# Patient Record
Sex: Female | Born: 2000 | Race: Black or African American | Hispanic: No | Marital: Single | Smoking: Never smoker
Health system: Southern US, Community
[De-identification: ages and names within clinical notes are randomized; demographics above are authoritative.]

## PROBLEM LIST (undated history)

## (undated) DIAGNOSIS — H539 Unspecified visual disturbance: Secondary | ICD-10-CM

## (undated) DIAGNOSIS — M419 Scoliosis, unspecified: Secondary | ICD-10-CM

## (undated) DIAGNOSIS — S83003A Unspecified subluxation of unspecified patella, initial encounter: Secondary | ICD-10-CM

## (undated) HISTORY — DX: Unspecified subluxation of unspecified patella, initial encounter: S83.003A

## (undated) HISTORY — DX: Unspecified visual disturbance: H53.9

## (undated) HISTORY — DX: Scoliosis, unspecified: M41.9

## (undated) NOTE — Progress Notes (Signed)
 Formatting of this note is different from the original. Images from the original note were not included. [facility] PROGRESS NOTE - CONFIDENTIAL   Visit Type:  Video Visit [facility]  Patient is attending visit via a telehealth appointment today. Patient has agreed to complete the appointment via telehealth, and is currently located within a jurisdiction this practitioner is licensed.  I have reviewed the patient's chart.  I have verified the patient's identity and the patient has consented to the Telemedicine appointment.  Due to appointment being via telehealth no signature was received, but patient has verbally consented to treatment & agreed to all treatment goals and objectives.  EMR reviewed and patient identity confirmed. 03/16/2024 4:29 PM  [facility] Coordinated Care Privacy Notice was reviewed by the member and access to documentation reviewed.  The member will be able to access all of this information via [email or web address] or HIMS  Confidentiality and its limits were reviewed today.  Patient AGREED to coordinated care, electing not to sign the Behavioral Health Coordination Opt Out Form, signifying his/her desire to not separate behavioral health information  from the routine health record. Proactive Care recommendations were reviewed with the patient. Patient seen alone with: CARRAH RIDDLE LCSW.   Length of session: 16-37 minutes  S:   Member complains of symptoms including depressed mood, decreased energy.   Met with member today who reported sleep disturbances. Experiencing physical symptoms of stress. Is on hydroxizine   O: MSE:  Appearance: well-groomed, clean, appropriately dressed, and appears their stated age Behavior: within normal limits, cooperative, and engages easily and relates well Demeanor/Manner: normal and cooperative Speech: normal (fluid and clear) Mood: Presentation - euthymic, irritable, anxious, and depressed Affect: congruent with  mood and within normal limits Thought Process: coherent, logical, relevant, and organized Thought Content: able to provide a cogent history, no evidence of perceptual difficulties (including AVH, PTSD type symptoms with flashbacks and dissociation, and paranoid or delusional thinking), denies suicidal ideation, plan, or intent, and denies homicidal ideation, plan, or intent Attention & Orientation: person, place, date, situation  Concentration: normal for interactions of session Intellectual Functioning/Memory:  intact recent and intact remote, not formally tested Impulse Control: Average, adequate for this appointment Insight:  Average, adequate for this appointment Judgment: Good/Socially appropriate    02/29/2024   12:45 PM 03/16/2024   11:00 AM  Behavioral Health Outcomes Questionnaire  Loss of Interest/Pleasure 1 - several days 1 - several days  Depressed Mood 2 - more than half the days 1 - several days  Insomnia/Hypersomnia 0 - not at all 1 - several days  Fatigue/Low Energy 1 - several days 0 - not at all  Appetite Disturbance 0 - not at all 1 - several days  Feelings of guilt/worthlessness 1 - several days 1 - several days  Memory/Concentration Problems 1 - several days 0 - not at all  Psychomotor Retardation/Agitation 0 - not at all 0 - not at all  Thoughts of Death/Self-Harm 0 - not at all 0 - not at all  PHQ-9 TOTAL SCORE 6 5  Depression Severity  5-9 Mild 5-9 Mild  Feeling nervous, anxious or on edge 2 - more than half the days 1 - several days  Not being able to stop or control worrying 1 - several days 1 - several days  How well have you been getting along emotionally 1 - fairly well 2 - fairly poorly  How well have you been able to manage your day to day life  1 - fairly well 2 - fairly poorly  GAD-2 and DISTRESS TOTAL SCORE 5 6  PHQ-9, GAD and DISTRESS TOTAL SCORE 11 11     No data to display      Safety Plan:  N/A  A: DSM V Diagnoses  1. UNSPECIFIED ADJUSTMENT  DISORDER   2. BORDERLINE PERSONALITY DISORDER      P:  Progress towards goals and Treatment Plan: Pt improving with talk therapy  Psychotherapy Interventions: Solution focused reframing to elicit goal and future focused talk Eliciting positive experiences and exceptions to problematic symptoms Encouragement to maintain current positive and effective coping tools and strategies  Homework Assigned: yes    Goodness of Fit Reviewed/Competed:No Community Resources: no    We discussed the diagnosis, the reasonable treatment options and the current treatment recommendations.  An opportunity for questions was given. The patient reported that they are safe to leave from appt.   This clinical research associate reviewed [employer] including the available Emergency phone number, the Medical Advice line, and Urgent Care services. This clinical research associate also reviewed guidelines if patient has urgent concerns or in the event of an emergency situation including calling 9-1-1 or going to the closest emergency room.  Henrine acknowledged understanding of these services and plan.    Information in Visit Notes was reviewed with the patient.  Follow-Up Appointment: To contact the undersigned if he/she has any questions or concerns or requires further assistance.   [facility] RIDDLE LCSW, March 16, 2024   Electronically signed by Beecher Raspberry (L.C.S.W.), L.C.S.W. at 03/28/2024  9:43 PM EST

---
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
## 2020-07-28 ENCOUNTER — Emergency Department (HOSPITAL_COMMUNITY)
Admission: EM | Admit: 2020-07-28 | Discharge: 2020-07-28 | Disposition: A | Discharge status: Home / Self Care | Payer: Medicaid - Out of State | Attending: Emergency Medicine | Admitting: Emergency Medicine

## 2020-07-28 ENCOUNTER — Encounter (HOSPITAL_COMMUNITY): Payer: Self-pay

## 2020-07-28 ENCOUNTER — Other Ambulatory Visit: Payer: Self-pay

## 2020-07-28 DIAGNOSIS — K648 Other hemorrhoids: Secondary | ICD-10-CM | POA: Diagnosis not present

## 2020-07-28 DIAGNOSIS — K625 Hemorrhage of anus and rectum: Secondary | ICD-10-CM | POA: Diagnosis present

## 2020-07-28 DIAGNOSIS — K644 Residual hemorrhoidal skin tags: Secondary | ICD-10-CM

## 2020-07-28 LAB — CBC
HCT: 43.9 % (ref 36.0–46.0)
Hemoglobin: 13.8 g/dL (ref 12.0–15.0)
MCH: 27.4 pg (ref 26.0–34.0)
MCHC: 31.4 g/dL (ref 30.0–36.0)
MCV: 87.3 fL (ref 80.0–100.0)
Platelets: 271 10*3/uL (ref 150–400)
RBC: 5.03 MIL/uL (ref 3.87–5.11)
RDW: 13.1 % (ref 11.5–15.5)
WBC: 6.3 10*3/uL (ref 4.0–10.5)
nRBC: 0 % (ref 0.0–0.2)

## 2020-07-28 LAB — COMPREHENSIVE METABOLIC PANEL
ALT: 12 U/L (ref 0–44)
AST: 23 U/L (ref 15–41)
Albumin: 3.8 g/dL (ref 3.5–5.0)
Alkaline Phosphatase: 59 U/L (ref 38–126)
Anion gap: 9 (ref 5–15)
BUN: 8 mg/dL (ref 6–20)
CO2: 24 mmol/L (ref 22–32)
Calcium: 9 mg/dL (ref 8.9–10.3)
Chloride: 104 mmol/L (ref 98–111)
Creatinine, Ser: 1.01 mg/dL — ABNORMAL HIGH (ref 0.44–1.00)
GFR, Estimated: >60 mL/min (ref >60)
Glucose, Bld: 76 mg/dL (ref 70–99)
Potassium: 3.6 mmol/L (ref 3.5–5.1)
Sodium: 137 mmol/L (ref 135–145)
Total Bilirubin: 0.5 mg/dL (ref 0.3–1.2)
Total Protein: 7.1 g/dL (ref 6.5–8.1)

## 2020-07-28 LAB — LIPASE, BLOOD: Lipase: 22 U/L (ref 11–51)

## 2020-07-28 MED ORDER — HYDROCORTISONE (PERIANAL) 2.5 % EX CREA: 1.0000 "application " | TOPICAL_CREAM | Freq: Two times a day (BID) | CUTANEOUS | 0 refills | Status: AC

## 2020-07-28 MED ORDER — ONDANSETRON 4 MG PO TBDP: 4.0000 mg | ORAL_TABLET | Freq: Three times a day (TID) | ORAL | 0 refills | Status: AC | PRN

## 2020-07-28 MED ORDER — POLYETHYLENE GLYCOL 3350 17 G PO PACK: 17.0000 g | PACK | Freq: Every day | ORAL | 0 refills | Status: AC | PRN

## 2020-07-28 NOTE — ED Triage Notes (Signed)
Patient reports abdominal pain x 3 days, states she thought she might have food poisoning, stated with rectal bleeding last night

## 2020-07-28 NOTE — ED Triage Notes (Signed)
Emergency Medicine Provider Triage Evaluation Note  Michele Soto , a 20 y.o. female  was evaluated in triage.  Pt complains of recent epigastric pain. Pt states unsure of related to eating some food that was left out. Had episode of nausea/vomiting earlier - nausea currently resolved. Also states noted small amount bright red blood per rectum. No hx fissure or hemorrhoid. No melena. No vaginal bleeding - lnmp 1 month ago, takes ocp, and indicates is due to period to start in next 1-2 days. No faintness/lightheadedness. Hx anemia  Review of Systems  Positive: abd pain Negative: No fever.   Physical Exam  BP (!) 149/96 (BP Location: Right Arm)   Pulse (!) 103   Temp 98.4 F (36.9 C) (Oral)   Resp 18   SpO2 99%  Gen:   Awake, no distress   HEENT:  Atraumatic  Resp:  Normal effort  Cardiac:  Normal rate  Abd:   Nondistended, nontender  Medical Decision Making  Medically screening exam initiated at 8:14 PM.  Appropriate orders placed.  [name] was informed that the remainder of the evaluation will be completed by another provider, this initial triage assessment does not replace that evaluation, and the importance of remaining in the ED until their evaluation is complete.  Clinical Impression  Labs sent. Will move to treatment room once available.    [name], MD 07/28/20 2016

## 2020-07-28 NOTE — ED Provider Notes (Signed)
[facility] EMERGENCY DEPARTMENT Provider Note   CSN: [id number] Arrival date & time: 07/28/20  2004     History Chief Complaint  Patient presents with  . Abdominal Pain  . Rectal Bleeding    Michele Soto is a 20 y.o. female with a hx of scoliosis who presents to the ED with reports of abdominal discomfort & concern for rectal bleeding. Patient reports she ate some shrimp yesterday that she thinks were bad as she developed some epigastric discomfort shortly following consumption with some associated nausea. This AM she felt better therefore she tried to eat some rice with return of discomfort & nausea with 1 episode of emesis. Her nausea & pain have resolved and she has since tolerated PO. She however does note that she has had 2 episodes of bright red blood on the toilet paper from her rectum when wiping after having to strain for a BM, once yesterday and once today. Given the blood she decided to come to the ED to get checked out. Has since had a BM without bleeding. Did have some discomfort when straining, none now. No other alleviating/aggravaitng factors to her sxs. Denies fever, chills, melena, hematochezia, dizziness, lightheadedness, hematemesis, dysuria, vaginal bleeding, or vaginal discharge. LMP 1 month ago. Does not typically use NSAIDs, not on anticoagulation.   HPI     Past Medical History:  Diagnosis Date  . Allergic rhinitis 05/05/2011  . Scoliosis   . Subluxation of patella    bilateral, recurrent  . Vision abnormalities    failed vision screen twice referred to opthalmology    Patient Active Problem List   Diagnosis Date Noted  . 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 Allergic rhinitis 05/05/2011  . Scoliosis 05/05/2011  . 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 scoliosis  2010   surgery at [facility], Dr. Rochele Pages     OB History   No obstetric history on file.     History reviewed. No  pertinent family history.  Social History   Tobacco Use  . Smoking status: Never Smoker  . Smokeless tobacco: Never Used  Substance Use Topics  . Alcohol use: No  . Drug use: No    Home Medications Prior to Admission medications   Medication Sig Start Date End Date Taking? Authorizing Provider  acetaminophen (TYLENOL) 500 MG tablet Take 500 mg by mouth every 6 (six) hours as needed for moderate pain or headache.   Yes [provider]  albuterol (PROVENTIL HFA;VENTOLIN HFA) 108 (90 BASE) MCG/ACT inhaler 2 puffs 30-45 minutes prior to exercise. Patient taking differently: Inhale 1-2 puffs into the lungs daily as needed for wheezing or shortness of breath. 07/14/11 08/13/11 Yes Lucio Edward, MD  Artificial Tear Ointment (DRY EYES OP) Place 1 drop into both eyes daily as needed (dry eyes).   Yes [provider]  JUNEL FE 1/20 1-20 MG-MCG tablet Take 1 tablet by mouth daily. 05/01/20  Yes [provider]  loratadine (CLARITIN) 10 MG tablet Take 10 mg by mouth at bedtime.   Yes [provider]  montelukast (SINGULAIR) 10 MG tablet Take 10 mg by mouth at bedtime. 05/07/20  Yes [provider]  OVER THE COUNTER MEDICATION Take 1 tablet by mouth every evening. Iron supplement gummy   Yes [provider]    Allergies    Amoxicillin and Penicillins  Review of Systems   Review of Systems  Constitutional: Negative for chills  and fever.  Respiratory: Negative for shortness of breath.   Cardiovascular: Negative for chest pain.  Gastrointestinal: Positive for abdominal pain, anal bleeding, constipation, nausea and vomiting.  Genitourinary: Negative for dysuria, vaginal bleeding and vaginal discharge.  Neurological: Negative for dizziness, syncope and light-headedness.  All other systems reviewed and are negative.   Physical Exam Updated Vital Signs BP (!) 149/96 (BP Location: Right Arm)   Pulse (!) 103   Temp 98.4 F (36.9 C) (Oral)    Resp 18   Ht 5\' 4"  (1.626 m)   Wt 68 kg   LMP 06/26/2020   SpO2 99%   BMI 25.75 kg/m   Physical Exam Vitals and nursing note reviewed.  Constitutional:      General: She is not in acute distress.    Appearance: She is well-developed. She is not toxic-appearing.  HENT:     Head: Normocephalic and atraumatic.  Eyes:     General:        Right eye: No discharge.        Left eye: No discharge.     Conjunctiva/sclera: Conjunctivae normal.  Cardiovascular:     Rate and Rhythm: Normal rate and regular rhythm.  Pulmonary:     Effort: Pulmonary effort is normal. No respiratory distress.     Breath sounds: Normal breath sounds. No wheezing, rhonchi or rales.  Abdominal:     General: There is no distension.     Palpations: Abdomen is soft.     Tenderness: There is no abdominal tenderness. There is no guarding or rebound.  Genitourinary:    Comments: NT present as chaperone.  Patient has a very small external hemorrhoid which is non-thrombosed with no active bleeding. No blood to external rectum. No melena noted. No areas of fluctuance/induration.  Musculoskeletal:     Cervical back: Neck supple.  Skin:    General: Skin is warm and dry.     Findings: No rash.  Neurological:     Mental Status: She is alert.     Comments: Clear speech.   Psychiatric:        Behavior: Behavior normal.     ED Results / Procedures / Treatments   Labs (all labs ordered are listed, but only abnormal results are displayed) Labs Reviewed  COMPREHENSIVE METABOLIC PANEL - Abnormal; Notable for the following components:      Result Value   Creatinine, Ser 1.01 (*)    All other components within normal limits  CBC  LIPASE, BLOOD    EKG None  Radiology No results found.  Procedures Procedures   Medications Ordered in ED Medications - No data to display  ED Course  I have reviewed the triage vital signs and the nursing notes.  Pertinent labs & imaging results that were available during my  care of the patient were reviewed by me and considered in my medical decision making (see chart for details).    MDM Rules/Calculators/A&P                         Patient presents to the ED with reports of N/V & abdominal discomfort which is now resolved & 2 episodes of rectal bleeding- blood on toilet paper after straining to have a BM. Nontoxic, initial tachycardia resolved on my exam, BP mildly elevated- doubt HTN emergency.   Additional history obtained:  Additional history obtained from chart review & nursing note review.   Lab Tests:  I reviewed and interpreted labs, which  included:  CBC, CMP, lipase: Fairly unremarkable. Mild elevation in creatinine @ 1.01 however in care everywhere last creatinine 1.0 therefore appears baseline. No significant electrolyte derangements. No anemia.   ED Course:  Patient has a small external hemorrhoid which is non-thrombosed, no active bleeding. Given straining & mild discomfort with BMs with small hemorrhoid suspect this as cause of bleeding. No significant fissure noted. No active bleeding. No reports of or visualized melena. Abdomen is nontender w/o peritoneal signs and patient's abdominal discomfort is resolved & she is now tolerating PO, I do not suspect acute surgical process such as obstruction, perf, appendicitis, or cholecystitis. Will start on miralax & anusol, will also provide zofran if nausea returns. PCP follow up. I discussed results, treatment plan, need for follow-up, and return precautions with the patient. Provided opportunity for questions, patient confirmed understanding and is in agreement with plan.   Portions of this note were generated with Scientist, clinical (histocompatibility and immunogenetics). Dictation errors may occur despite best attempts at proofreading.  Final Clinical Impression(s) / ED Diagnoses Final diagnoses:  External hemorrhoid    Rx / DC Orders ED Discharge Orders         Ordered    hydrocortisone (ANUSOL-HC) 2.5 % rectal cream  2 times  daily        07/28/20 2303    ondansetron (ZOFRAN ODT) 4 MG disintegrating tablet  Every 8 hours PRN        07/28/20 2303    polyethylene glycol (MIRALAX) 17 g packet  Daily PRN        07/28/20 2303           Cherly Anderson, PA-C 07/28/20 2304    Geoffery Lyons, MD 07/29/20 [phone]

## 2020-07-28 NOTE — Discharge Instructions (Addendum)
You were seen in the emergency department today for rectal bleeding.  Your labs were overall reassuring, your creatinine was mildly elevated but similar to prior, this looks at kidney function, please continue to have this rechecked by your primary care provider.   You have a small external hemorrhoid.  We are sending you home with the following medicine: -Anusol: Please use rectal cream twice per day as needed for hemorrhoid discomfort. -Zofran: Take every 8 hours as needed for nausea and vomiting - MiraLAX: Take daily as needed for constipation or having to strain with bowel movements.  We have prescribed you new medication(s) today. Discuss the medications prescribed today with your pharmacist as they can have adverse effects and interactions with your other medicines including over the counter and prescribed medications. Seek medical evaluation if you start to experience new or abnormal symptoms after taking one of these medicines, seek care immediately if you start to experience difficulty breathing, feeling of your throat closing, facial swelling, or rash as these could be indications of a more serious allergic reaction  Please follow-up with your primary care provider within 3 days for reevaluation.  Return to the ER for new or worsening symptoms including but not limited to new or worsening abdominal pain, inability to keep fluids down, fever, blood in vomit, continued or uncontrolled bleeding from rectum, dark/tarry stools, dizziness, lightheadedness, passing out, or any other concerns.

## 2020-12-02 ENCOUNTER — Emergency Department (HOSPITAL_COMMUNITY)
Admission: EM | Admit: 2020-12-02 | Discharge: 2020-12-02 | Disposition: A | Discharge status: Home / Self Care | Payer: Medicaid - Out of State | Attending: Emergency Medicine | Admitting: Emergency Medicine

## 2020-12-02 ENCOUNTER — Encounter (HOSPITAL_COMMUNITY): Payer: Self-pay | Admitting: Emergency Medicine

## 2020-12-02 DIAGNOSIS — S61254A Open bite of right ring finger without damage to nail, initial encounter: Secondary | ICD-10-CM | POA: Diagnosis not present

## 2020-12-02 DIAGNOSIS — W5501XA Bitten by cat, initial encounter: Secondary | ICD-10-CM | POA: Diagnosis not present

## 2020-12-02 DIAGNOSIS — S6991XA Unspecified injury of right wrist, hand and finger(s), initial encounter: Secondary | ICD-10-CM | POA: Diagnosis present

## 2020-12-02 MED ORDER — SULFAMETHOXAZOLE-TRIMETHOPRIM 800-160 MG PO TABS: 1.0000 | ORAL_TABLET | Freq: Two times a day (BID) | ORAL | 0 refills | Status: AC

## 2020-12-02 MED ORDER — CLINDAMYCIN HCL 300 MG PO CAPS: 300.0000 mg | ORAL_CAPSULE | Freq: Three times a day (TID) | ORAL | 0 refills | Status: AC

## 2020-12-02 NOTE — ED Provider Notes (Signed)
[facility] EMERGENCY DEPARTMENT Provider Note   CSN: [id number] Arrival date & time: 12/02/20  1354     History No chief complaint on file.   Michele Soto is a 20 y.o. female.  20 year old female presents after cat bite to the right fourth finger.  Patient has taken in a stray cat 5 days ago, took the cat to the vet to set the cat was about 17 weeks old and too young for vaccines.  Patient was feeding the cat today when the cat accidentally bit her right fourth finger resulting in a very minor injury to the finger.  Patient cleaned the wound at home looking to the emergency room for evaluation and consideration for rabies vaccines.  Last tetanus was in 2021.  No other complaints or concerns.      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 Rochele Pages     OB History   No obstetric history on file.     No family history on file.  Social History   Tobacco Use   Smoking status: Never   Smokeless tobacco: Never  Substance Use Topics   Alcohol use: No   Drug use: No    Home Medications Prior to Admission medications   Medication Sig Start Date End Date Taking? Authorizing Provider  clindamycin (CLEOCIN) 300 MG capsule Take 1 capsule (300 mg total) by mouth 3 (three) times daily for 7 days. 12/02/20 12/09/20 Yes Jeannie Fend, PA-C  sulfamethoxazole-trimethoprim (BACTRIM DS) 800-160 MG tablet Take 1 tablet by mouth 2 (two) times daily for 7 days. 12/02/20 12/09/20 Yes Jeannie Fend, PA-C  acetaminophen (TYLENOL)  500 MG tablet Take 500 mg by mouth every 6 (six) hours as needed for moderate pain or headache.    [provider]  albuterol (PROVENTIL HFA;VENTOLIN HFA) 108 (90 BASE) MCG/ACT inhaler 2 puffs 30-45 minutes prior to exercise. Patient taking differently: Inhale 1-2 puffs into the lungs daily as needed for wheezing or shortness of breath. 07/14/11 08/13/11  Lucio Edward, MD  Artificial Tear Ointment (DRY EYES OP) Place 1 drop into both eyes daily as needed (dry eyes).    [provider]  hydrocortisone (ANUSOL-HC) 2.5 % rectal cream Place 1 application rectally 2 (two) times daily. 07/28/20   Petrucelli, Pleas Koch, PA-C  JUNEL FE 1/20 1-20 MG-MCG tablet Take 1 tablet by mouth daily. 05/01/20   [provider]  loratadine (CLARITIN) 10 MG tablet Take 10 mg by mouth at bedtime.    [provider]  montelukast (SINGULAIR) 10 MG tablet Take 10 mg by mouth at bedtime. 05/07/20   [provider]  ondansetron (ZOFRAN ODT) 4 MG disintegrating tablet Take 1 tablet (4 mg total) by mouth every 8 (eight) hours as needed for nausea or vomiting. 07/28/20   Petrucelli, Samantha R, PA-C  OVER THE COUNTER MEDICATION Take 1 tablet by mouth every evening. Iron supplement gummy    [provider]  polyethylene glycol (MIRALAX) 17 g packet Take  17 g by mouth daily as needed for moderate constipation. 07/28/20   Petrucelli, Samantha R, PA-C    Allergies    Amoxicillin and Penicillins  Review of Systems   Review of Systems  Constitutional:  Negative for fever.  Musculoskeletal:  Negative for arthralgias and myalgias.  Skin:  Positive for wound.  Allergic/Immunologic: Negative for immunocompromised state.  Neurological:  Negative for weakness and numbness.  Hematological:  Negative for adenopathy.  All other systems reviewed and are negative.  Physical Exam Updated Vital Signs BP 108/71 (BP Location: Left Arm)   Pulse 72   Temp 98.7 F (37.1 C)   Resp 19   SpO2  100%   Physical Exam Vitals and nursing note reviewed.  Constitutional:      General: She is not in acute distress.    Appearance: She is well-developed. She is not diaphoretic.  HENT:     Head: Normocephalic and atraumatic.  Pulmonary:     Effort: Pulmonary effort is normal.  Musculoskeletal:        General: No swelling, tenderness or deformity.     Comments: Very minor graze type wound to distal right fourth finger.  Skin:    General: Skin is warm and dry.     Findings: No erythema or rash.  Neurological:     Mental Status: She is alert and oriented to person, place, and time.  Psychiatric:        Behavior: Behavior normal.    ED Results / Procedures / Treatments   Labs (all labs ordered are listed, but only abnormal results are displayed) Labs Reviewed - No data to display  EKG None  Radiology No results found.  Procedures Procedures   Medications Ordered in ED Medications - No data to display  ED Course  I have reviewed the triage vital signs and the nursing notes.  Pertinent labs & imaging results that were available during my care of the patient were reviewed by me and considered in my medical decision making (see chart for details).  Clinical Course as of 12/02/20 1600  Sun Dec 02, 2020  7465 20 year old female with cat bite to right fourth finger as above.  Wound appears to be very minor.  Her tetanus is up-to-date.  Discussed follow-up with animal control, report filed by registration today.  If appropriate, cat can be quarantined and patient can avoid rabies vaccines at this time. [LM]  1559 Prophylactic antibiotics. [LM]    Clinical Course User Index [LM] Alden Hipp   MDM Rules/Calculators/A&P                           Final Clinical Impression(s) / ED Diagnoses Final diagnoses:  None    Rx / DC Orders ED Discharge Orders          Ordered    clindamycin (CLEOCIN) 300 MG capsule  3 times daily        12/02/20 1453     sulfamethoxazole-trimethoprim (BACTRIM DS) 800-160 MG tablet  2 times daily        12/02/20 1453             Jeannie Fend, PA-C 12/02/20 1600    Derwood Kaplan, MD 12/02/20 2156

## 2020-12-02 NOTE — ED Notes (Signed)
Pt being discharged by PA at triage. 

## 2020-12-02 NOTE — Discharge Instructions (Addendum)
Follow up with animal control regarding animal quarantine. If animal is unable to be located, you will need to start the rabies series within 10 days.  Keep wound clean. Take antibiotics as prescribed and complete the full course. Return, go to [facility] or student health for any concerns.

## 2020-12-02 NOTE — ED Triage Notes (Signed)
Pt states she took in a stray kitten and has been feeding it and got nipped on R 4th finger 5 days ago.

## 2021-01-10 ENCOUNTER — Other Ambulatory Visit: Payer: Self-pay

## 2021-01-10 ENCOUNTER — Emergency Department (HOSPITAL_COMMUNITY): Payer: 59

## 2021-01-10 ENCOUNTER — Encounter (HOSPITAL_COMMUNITY): Payer: Self-pay | Admitting: Emergency Medicine

## 2021-01-10 ENCOUNTER — Emergency Department (HOSPITAL_COMMUNITY)
Admission: EM | Admit: 2021-01-10 | Discharge: 2021-01-10 | Disposition: A | Discharge status: Home / Self Care | Payer: 59 | Attending: Emergency Medicine | Admitting: Emergency Medicine

## 2021-01-10 DIAGNOSIS — Z23 Encounter for immunization: Secondary | ICD-10-CM | POA: Insufficient documentation

## 2021-01-10 DIAGNOSIS — S060XAA Concussion with loss of consciousness status unknown, initial encounter: Secondary | ICD-10-CM | POA: Diagnosis not present

## 2021-01-10 DIAGNOSIS — X58XXXA Exposure to other specified factors, initial encounter: Secondary | ICD-10-CM | POA: Diagnosis not present

## 2021-01-10 DIAGNOSIS — S0990XA Unspecified injury of head, initial encounter: Secondary | ICD-10-CM | POA: Diagnosis present

## 2021-01-10 DIAGNOSIS — Z79899 Other long term (current) drug therapy: Secondary | ICD-10-CM | POA: Diagnosis not present

## 2021-01-10 LAB — CBC WITH DIFFERENTIAL/PLATELET
Abs Immature Granulocytes: 0.02 10*3/uL (ref 0.00–0.07)
Basophils Absolute: 0 10*3/uL (ref 0.0–0.1)
Basophils Relative: 1 %
Eosinophils Absolute: 0 10*3/uL (ref 0.0–0.5)
Eosinophils Relative: 0 %
HCT: 46.1 % — ABNORMAL HIGH (ref 36.0–46.0)
Hemoglobin: 14.2 g/dL (ref 12.0–15.0)
Immature Granulocytes: 0 %
Lymphocytes Relative: 16 %
Lymphs Abs: 1 10*3/uL (ref 0.7–4.0)
MCH: 27.5 pg (ref 26.0–34.0)
MCHC: 30.8 g/dL (ref 30.0–36.0)
MCV: 89.2 fL (ref 80.0–100.0)
Monocytes Absolute: 0.2 10*3/uL (ref 0.1–1.0)
Monocytes Relative: 4 %
Neutro Abs: 5.3 10*3/uL (ref 1.7–7.7)
Neutrophils Relative %: 79 %
Platelets: 202 10*3/uL (ref 150–400)
RBC: 5.17 MIL/uL — ABNORMAL HIGH (ref 3.87–5.11)
RDW: 13.2 % (ref 11.5–15.5)
WBC: 6.7 10*3/uL (ref 4.0–10.5)
nRBC: 0 % (ref 0.0–0.2)

## 2021-01-10 LAB — I-STAT BETA HCG BLOOD, ED (MC, WL, AP ONLY): I-stat hCG, quantitative: <5 m[IU]/mL (ref <5)

## 2021-01-10 LAB — COMPREHENSIVE METABOLIC PANEL
ALT: 13 U/L (ref 0–44)
AST: 22 U/L (ref 15–41)
Albumin: 4.4 g/dL (ref 3.5–5.0)
Alkaline Phosphatase: 61 U/L (ref 38–126)
Anion gap: 8 (ref 5–15)
BUN: 12 mg/dL (ref 6–20)
CO2: 26 mmol/L (ref 22–32)
Calcium: 9.4 mg/dL (ref 8.9–10.3)
Chloride: 104 mmol/L (ref 98–111)
Creatinine, Ser: 0.81 mg/dL (ref 0.44–1.00)
GFR, Estimated: >60 mL/min (ref >60)
Glucose, Bld: 90 mg/dL (ref 70–99)
Potassium: 3.7 mmol/L (ref 3.5–5.1)
Sodium: 138 mmol/L (ref 135–145)
Total Bilirubin: 0.7 mg/dL (ref 0.3–1.2)
Total Protein: 7.7 g/dL (ref 6.5–8.1)

## 2021-01-10 LAB — ETHANOL: Alcohol, Ethyl (B): <10 mg/dL (ref <10)

## 2021-01-10 LAB — CBG MONITORING, ED: Glucose-Capillary: 88 mg/dL (ref 70–99)

## 2021-01-10 MED ORDER — TETANUS-DIPHTH-ACELL PERTUSSIS 5-2.5-18.5 LF-MCG/0.5 IM SUSY
0.5000 mL | PREFILLED_SYRINGE | Freq: Once | INTRAMUSCULAR | Status: AC
  Administered 2021-01-10: 0.5 mL via INTRAMUSCULAR
  Filled 2021-01-10: qty 0.5

## 2021-01-10 MED ORDER — ACETAMINOPHEN 325 MG PO TABS
650.0000 mg | ORAL_TABLET | Freq: Once | ORAL | Status: AC
  Administered 2021-01-10: 650 mg via ORAL
  Filled 2021-01-10: qty 2

## 2021-01-10 NOTE — ED Notes (Signed)
Pt ambulatory without assistance.  

## 2021-01-10 NOTE — Discharge Instructions (Addendum)
You can use tylenol/ibuprofen as needed for pain.  Avoid eyestrain and reading fine print.  Make sure you are getting plenty of rest and stay hydrated.
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
## 2021-01-13 ENCOUNTER — Ambulatory Visit (HOSPITAL_COMMUNITY)
Admission: EM | Admit: 2021-01-13 | Discharge: 2021-01-13 | Discharge status: Against Medical Advice | Payer: Medicaid - Out of State

## 2021-01-13 ENCOUNTER — Emergency Department (HOSPITAL_COMMUNITY)
Admission: EM | Admit: 2021-01-13 | Discharge: 2021-01-13 | Disposition: A | Discharge status: Home / Self Care | Payer: Medicaid - Out of State | Attending: Emergency Medicine | Admitting: Emergency Medicine

## 2021-01-13 ENCOUNTER — Encounter (HOSPITAL_COMMUNITY): Payer: Self-pay

## 2021-01-13 ENCOUNTER — Other Ambulatory Visit: Payer: Self-pay

## 2021-01-13 DIAGNOSIS — Y9301 Activity, walking, marching and hiking: Secondary | ICD-10-CM | POA: Insufficient documentation

## 2021-01-13 DIAGNOSIS — Y92480 Sidewalk as the place of occurrence of the external cause: Secondary | ICD-10-CM | POA: Diagnosis not present

## 2021-01-13 DIAGNOSIS — S0990XD Unspecified injury of head, subsequent encounter: Secondary | ICD-10-CM | POA: Diagnosis present

## 2021-01-13 DIAGNOSIS — S060X9D Concussion with loss of consciousness of unspecified duration, subsequent encounter: Secondary | ICD-10-CM | POA: Diagnosis not present

## 2021-01-13 DIAGNOSIS — W101XXA Fall (on)(from) sidewalk curb, initial encounter: Secondary | ICD-10-CM | POA: Diagnosis not present

## 2021-01-13 DIAGNOSIS — M436 Torticollis: Secondary | ICD-10-CM | POA: Insufficient documentation
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
## 2022-06-22 ENCOUNTER — Other Ambulatory Visit: Payer: Self-pay

## 2022-06-22 ENCOUNTER — Emergency Department (HOSPITAL_COMMUNITY)
Admission: EM | Admit: 2022-06-22 | Discharge: 2022-06-22 | Disposition: A | Discharge status: Home / Self Care | Payer: 59 | Attending: Emergency Medicine | Admitting: Emergency Medicine

## 2022-06-22 ENCOUNTER — Encounter (HOSPITAL_COMMUNITY): Payer: Self-pay

## 2022-06-22 DIAGNOSIS — D649 Anemia, unspecified: Secondary | ICD-10-CM | POA: Insufficient documentation

## 2022-06-22 DIAGNOSIS — N939 Abnormal uterine and vaginal bleeding, unspecified: Secondary | ICD-10-CM | POA: Diagnosis present

## 2022-06-22 LAB — URINALYSIS, ROUTINE W REFLEX MICROSCOPIC
Bacteria, UA: NONE SEEN
Bilirubin Urine: NEGATIVE
Glucose, UA: NEGATIVE mg/dL
Ketones, ur: NEGATIVE mg/dL
Leukocytes,Ua: NEGATIVE
Nitrite: NEGATIVE
Protein, ur: NEGATIVE mg/dL
RBC / HPF: >50 RBC/hpf (ref 0–5)
Specific Gravity, Urine: 1.023 (ref 1.005–1.030)
pH: 7 (ref 5.0–8.0)

## 2022-06-22 LAB — CBC
HCT: 43.9 % (ref 36.0–46.0)
Hemoglobin: 13.6 g/dL (ref 12.0–15.0)
MCH: 27.3 pg (ref 26.0–34.0)
MCHC: 31 g/dL (ref 30.0–36.0)
MCV: 88.2 fL (ref 80.0–100.0)
Platelets: 222 10*3/uL (ref 150–400)
RBC: 4.98 MIL/uL (ref 3.87–5.11)
RDW: 13 % (ref 11.5–15.5)
WBC: 4.1 10*3/uL (ref 4.0–10.5)
nRBC: 0 % (ref 0.0–0.2)

## 2022-06-22 LAB — BASIC METABOLIC PANEL
Anion gap: 7 (ref 5–15)
BUN: 16 mg/dL (ref 6–20)
CO2: 26 mmol/L (ref 22–32)
Calcium: 8.8 mg/dL — ABNORMAL LOW (ref 8.9–10.3)
Chloride: 107 mmol/L (ref 98–111)
Creatinine, Ser: 0.79 mg/dL (ref 0.44–1.00)
GFR, Estimated: >60 mL/min (ref >60)
Glucose, Bld: 87 mg/dL (ref 70–99)
Potassium: 3.5 mmol/L (ref 3.5–5.1)
Sodium: 140 mmol/L (ref 135–145)

## 2022-06-22 LAB — I-STAT BETA HCG BLOOD, ED (MC, WL, AP ONLY): I-stat hCG, quantitative: <5 m[IU]/mL (ref <5)

## 2022-06-22 MED ORDER — MEDROXYPROGESTERONE ACETATE 10 MG PO TABS: 20.0000 mg | ORAL_TABLET | Freq: Three times a day (TID) | ORAL | 0 refills | Status: AC

## 2022-06-22 NOTE — Discharge Instructions (Addendum)
You were seen in the emergency department today for ongoing vaginal bleeding.  As we discussed your lab work was normal, your hemoglobin levels were normal.  Your pregnancy test was negative.  I am starting you on a high dose of progesterone to hopefully stop the bleeding. You will take this three times daily for up to 7 days, but you can stop it once bleeding has resolved.   Please follow up with your OBGYN as soon as possible for recheck! Continue to monitor how you're doing and return to the ER for new or worsening symptoms.

## 2022-06-22 NOTE — ED Triage Notes (Signed)
Patient is here for evaluation of extensive menstrual bleeding. Reports that her cycle has been on for 14 days with abdominal cramping. Reports feeling exhausted with limited amount of energy. Denies any heavy bleeding, but reports history of anemia and bleeding for 2 weeks.

## 2022-06-22 NOTE — ED Notes (Signed)
Pt A&OX4 ambulatory at d/c with independent steady gait. Pt verbalized understanding of d/c instructions, prescription and follow up care. 

## 2022-06-22 NOTE — ED Provider Notes (Signed)
[name] EMERGENCY DEPARTMENT AT [facility] Provider Note   CSN: [id number] Arrival date & time: 06/22/22  1418     History  Chief Complaint  Patient presents with   Vaginal Bleeding    Michele Soto is a 22 y.o. female with history of scoliosis and anemia who presents the emergency department complaining of persistent vaginal bleeding.  Patient states that her menstrual cycle normally last for 5 to 7 days, but is going on for the past 14 days.  She has had persistent abdominal cramping, which is normal for the first few days of her cycle, but is continued throughout this duration of bleeding.  She reports history of anemia, with hemoglobin level as low as 7.  Did not require a blood transfusion at that time.  She feels exhausted and fatigued, at one point said she thought she might pass out.  Patient states that her menstrual cycle started the day after last having intercourse with her partner, but denied any scrotal bleeding or pain.  Does not use tampons, denies having inserted anything into the vaginal canal.   Vaginal Bleeding Associated symptoms: fatigue        Home Medications Prior to Admission medications   Medication Sig Start Date End Date Taking? Authorizing Provider  medroxyPROGESTERone (PROVERA) 10 MG tablet Take 2 tablets (20 mg total) by mouth 3 (three) times daily for 7 days. 06/22/22 06/29/22 Yes Alyse Kathan T, PA-C  acetaminophen (TYLENOL) 500 MG tablet Take 500 mg by mouth every 6 (six) hours as needed for moderate pain or headache.    [provider]  albuterol (PROVENTIL HFA;VENTOLIN HFA) 108 (90 BASE) MCG/ACT inhaler 2 puffs 30-45 minutes prior to exercise. Patient taking differently: Inhale 1-2 puffs into the lungs daily as needed for wheezing or shortness of breath. 07/14/11 08/13/11  Saddie Benders, MD  Artificial Tear Ointment (DRY EYES OP) Place 1 drop into both eyes daily as needed (dry eyes).    [provider]   hydrocortisone (ANUSOL-HC) 2.5 % rectal cream Place 1 application rectally 2 (two) times daily. 07/28/20   Petrucelli, Glynda Jaeger, PA-C  JUNEL FE 1/20 1-20 MG-MCG tablet Take 1 tablet by mouth daily. 05/01/20   [provider]  loratadine (CLARITIN) 10 MG tablet Take 10 mg by mouth at bedtime.    [provider]  montelukast (SINGULAIR) 10 MG tablet Take 10 mg by mouth at bedtime. 05/07/20   [provider]  ondansetron (ZOFRAN ODT) 4 MG disintegrating tablet Take 1 tablet (4 mg total) by mouth every 8 (eight) hours as needed for nausea or vomiting. 07/28/20   Petrucelli, Samantha R, PA-C  OVER THE COUNTER MEDICATION Take 1 tablet by mouth every evening. Iron supplement gummy    [provider]  polyethylene glycol (MIRALAX) 17 g packet Take 17 g by mouth daily as needed for moderate constipation. 07/28/20   Petrucelli, Samantha R, PA-C      Allergies    Amoxicillin and Penicillins    Review of Systems   Review of Systems  Constitutional:  Positive for fatigue.  Gastrointestinal:        Abdominal cramping  Genitourinary:  Positive for vaginal bleeding.  Neurological:  Positive for light-headedness.  All other systems reviewed and are negative.   Physical Exam Updated Vital Signs BP 121/86   Pulse 69   Temp 98.8 F (37.1 C) (Oral)   Resp 15   Ht 5\' 3"  (1.6 m)   Wt 70.3 kg  LMP 06/08/2022   SpO2 100%   BMI 27.46 kg/m  Physical Exam Vitals and nursing note reviewed.  Constitutional:      Appearance: Normal appearance.  HENT:     Head: Normocephalic and atraumatic.  Eyes:     Conjunctiva/sclera: Conjunctivae normal.  Cardiovascular:     Rate and Rhythm: Normal rate and regular rhythm.  Pulmonary:     Effort: Pulmonary effort is normal. No respiratory distress.     Breath sounds: Normal breath sounds.  Abdominal:     General: There is no distension.     Palpations: Abdomen is soft.     Tenderness: There is no abdominal tenderness.   Skin:    General: Skin is warm and dry.  Neurological:     General: No focal deficit present.     Mental Status: She is alert.     ED Results / Procedures / Treatments   Labs (all labs ordered are listed, but only abnormal results are displayed) Labs Reviewed  BASIC METABOLIC PANEL - Abnormal; Notable for the following components:      Result Value   Calcium 8.8 (*)    All other components within normal limits  URINALYSIS, ROUTINE W REFLEX MICROSCOPIC - Abnormal; Notable for the following components:   Hgb urine dipstick LARGE (*)    All other components within normal limits  CBC  I-STAT BETA HCG BLOOD, ED ([facility], [facility], [facility] ONLY)    EKG None  Radiology No results found.  Procedures Procedures    Medications Ordered in ED Medications - No data to display  ED Course/ Medical Decision Making/ A&P                             Medical Decision Making Amount and/or Complexity of Data Reviewed Labs: ordered.   This patient is a 22 y.o. female  who presents to the ED for concern of vaginal bleeding x 2 weeks.   Differential diagnoses prior to evaluation: The emergent differential diagnosis includes, but is not limited to,  Abnormal uterine bleeding, threatened miscarriage, incomplete miscarriage, normal bleeding from an early trimester pregnancy, ectopic pregnancy, vaginal/cervical trauma, subchorionic hemorrhage/hematoma. This is not an exhaustive differential.   Past Medical History / Co-morbidities: Scoliosis, anemia  Additional history: Chart reviewed. Pertinent results include: Previously followed with OBGYN in [place]. Most recent hemoglobin and hematocrit on file 12.6 and 41.3 respectively.   Physical Exam: Physical exam performed. The pertinent findings include: Normal vital signs, no acute distress.  Abdomen soft and nontender. Do not think pelvic exam would be helpful as I have low suspicion for vaginal trauma or retained foreign body, discussed this with  patient and she is agreeable.  Lab Tests/Imaging studies: I personally interpreted labs/imaging and the pertinent results include: CBC with normal hemoglobin, no leukocytosis.  BMP unremarkable.  Negative pregnancy.  Urinalysis with large hemoglobin, negative for infection.  Disposition: After consideration of the diagnostic results and the patients response to treatment, I feel that emergency department workup does not suggest an emergent condition requiring admission or immediate intervention beyond what has been performed at this time. The plan is: discharge to home with recommendations for OBGYN follow up and prescription for medroxyprogesterone. Reassuring lab work. The patient is safe for discharge and has been instructed to return immediately for worsening symptoms, change in symptoms or any other concerns.  Final Clinical Impression(s) / ED Diagnoses Final diagnoses:  Vaginal bleeding    Rx /  DC Orders ED Discharge Orders          Ordered    medroxyPROGESTERone (PROVERA) 10 MG tablet  3 times daily        06/22/22 1737           Portions of this report may have been transcribed using voice recognition software. Every effort was made to ensure accuracy; however, inadvertent computerized transcription errors may be present.    Estill Cotta 06/22/22 1748    Charlesetta Shanks, MD 06/23/22 1943

## 2023-07-17 IMAGING — CT CT HEAD W/O CM
3 series · 14 of 47 positions shown, 16 images · non-contrast
Comparison: CT head 08/04/2008

CLINICAL DATA: Mental status change

EXAM:
CT HEAD WITHOUT CONTRAST
TECHNIQUE: Contiguous axial images were obtained from the base of the skull
through the vertex without intravenous contrast.

[Series 2: head wo · axial · 0.47mm/px · z∈[-151,-26]mm · 8 of 31 slices shown, 10 images]
[im 3/31  brain]
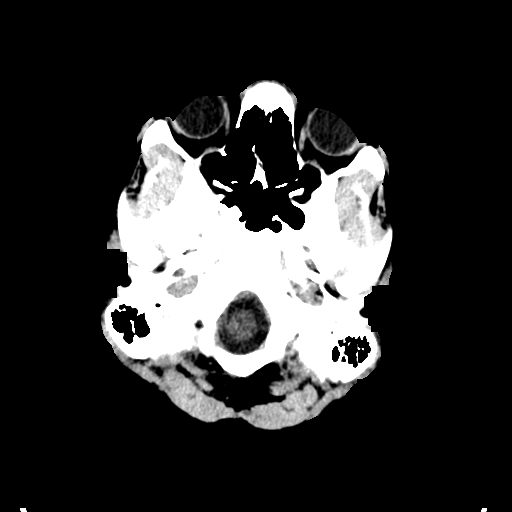
[im 3/31  bone]
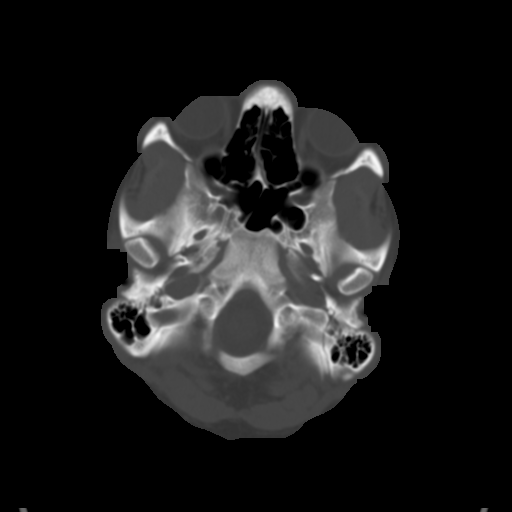
[im 7/31  brain]
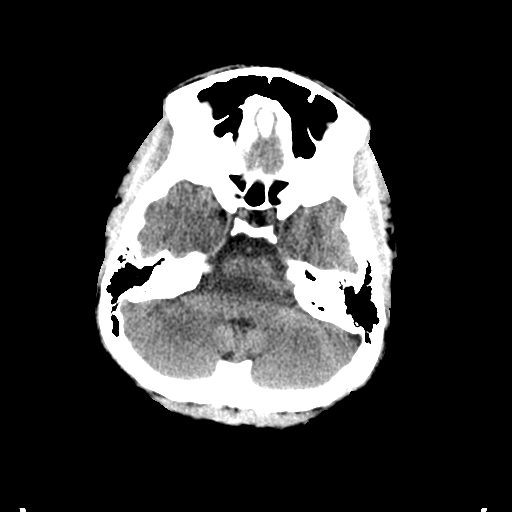
[im 10/31  brain]
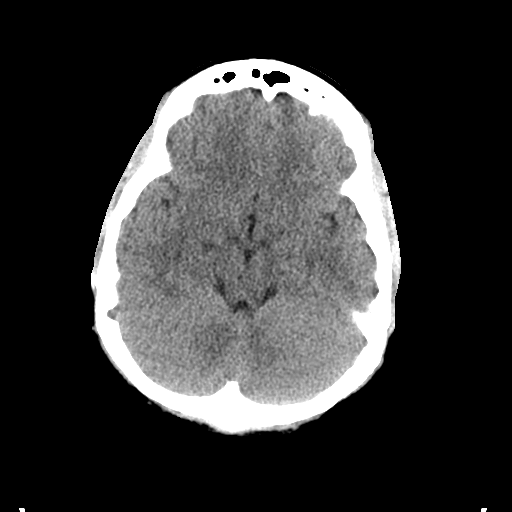
[im 14/31  brain]
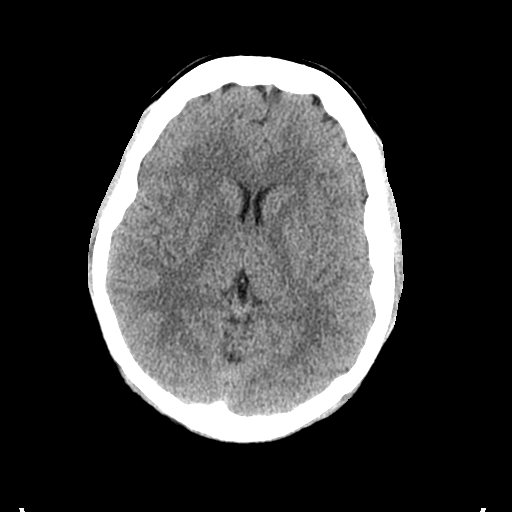
[im 17/31  brain]
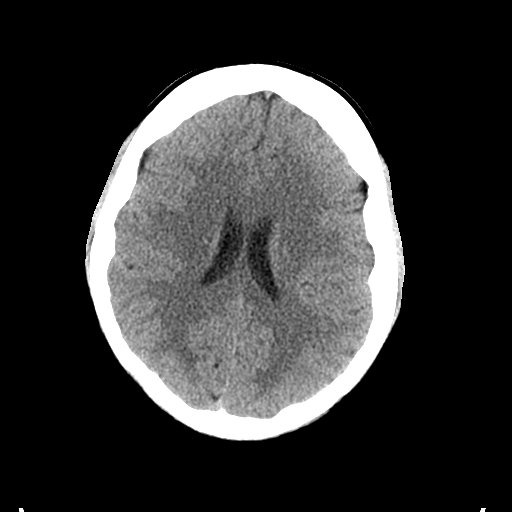
[im 17/31  bone]
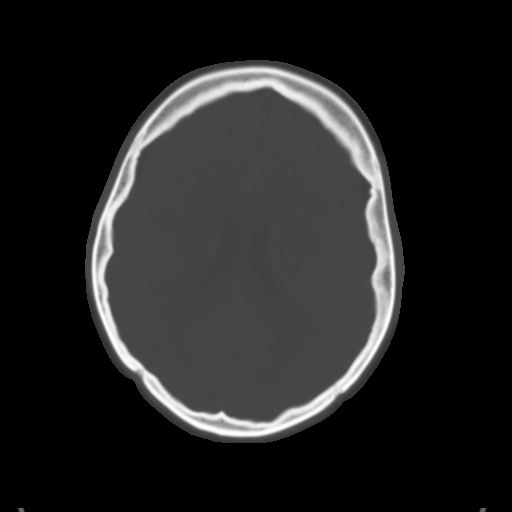
[im 21/31  brain]
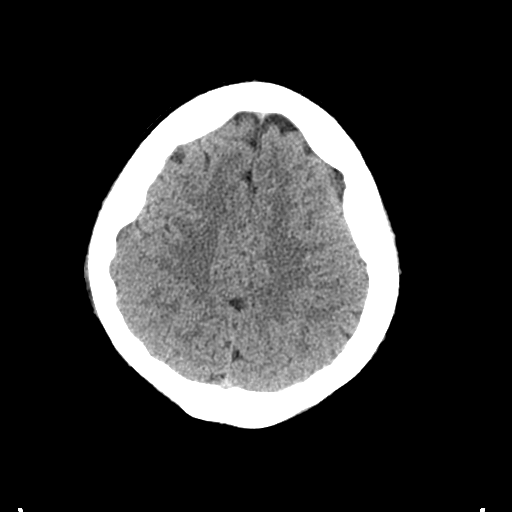
[im 24/31  brain]
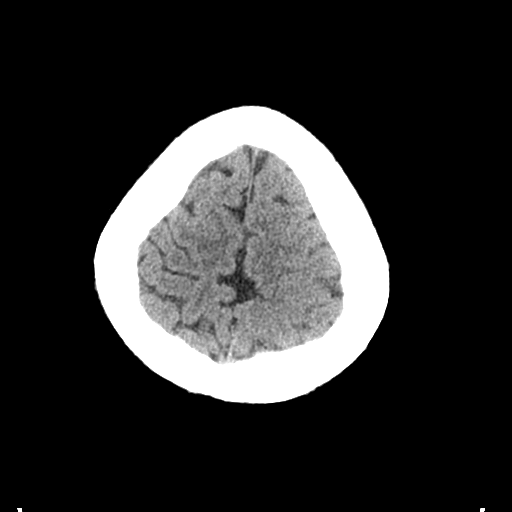
[im 28/31  brain]
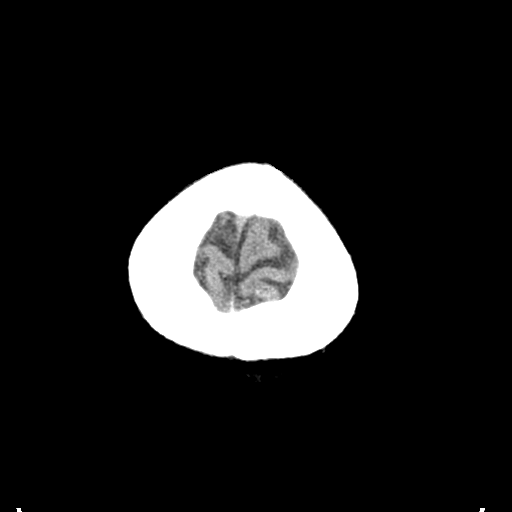

[Series 5: coronal soft tissue · coronal · 0.32mm/px · 3 of 67 slices shown]
[im 23/67  brain]
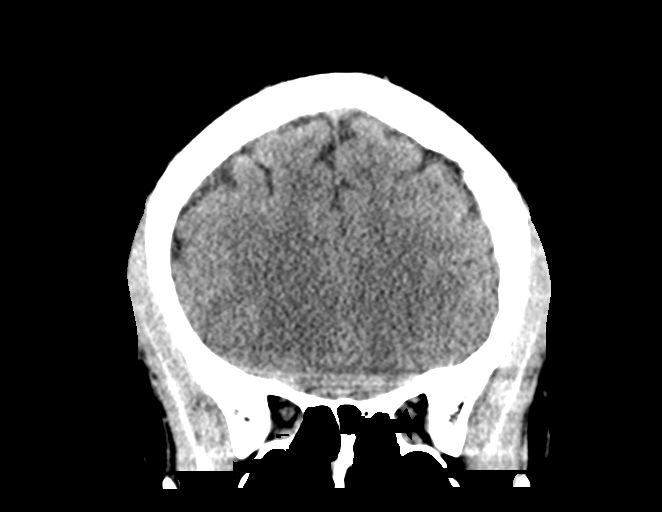
[im 30/67  brain]
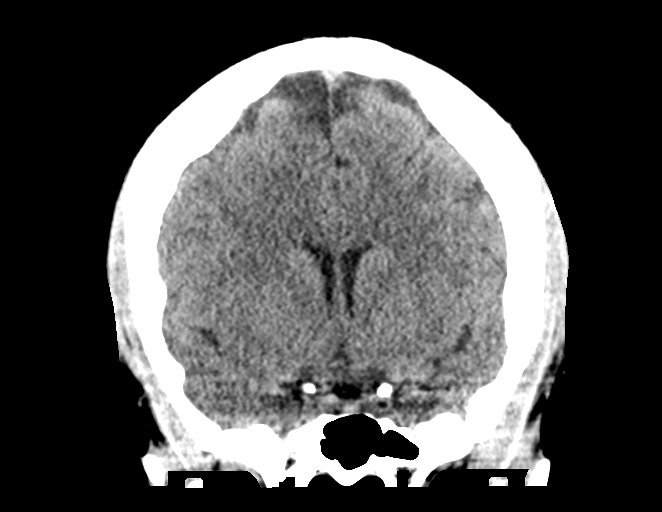
[im 37/67  brain]
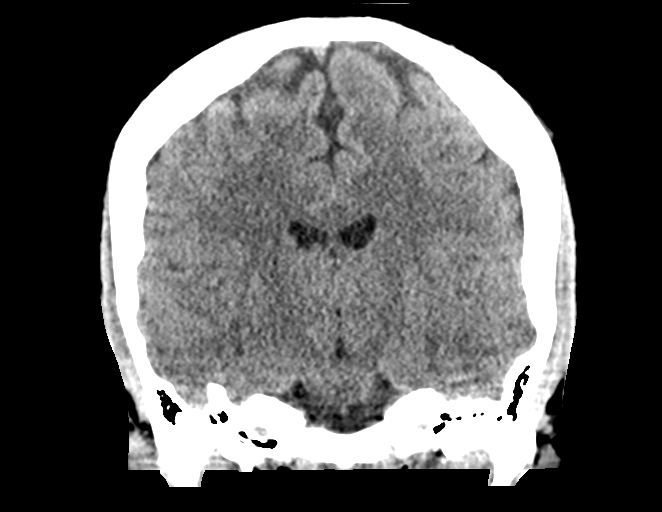

[Series 6: sagittal soft tissue · sagittal · 0.30mm/px · 3 of 66 slices shown]
[im 22/66  brain]
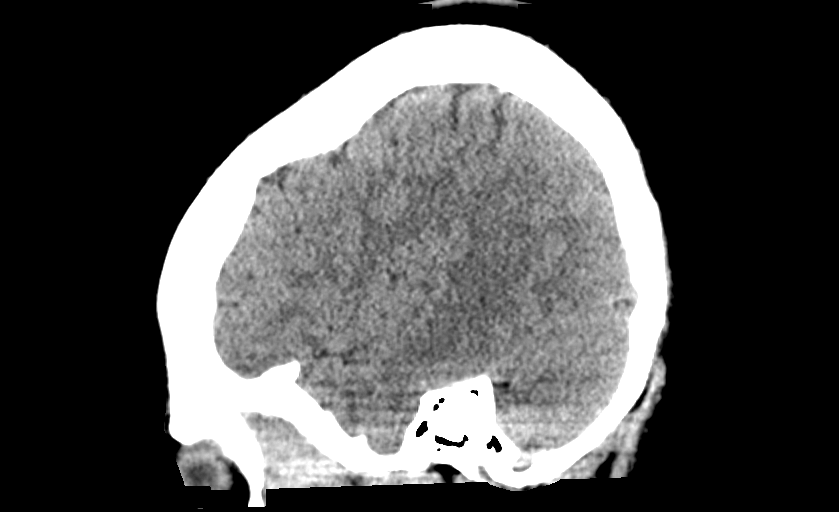
[im 33/66  brain]
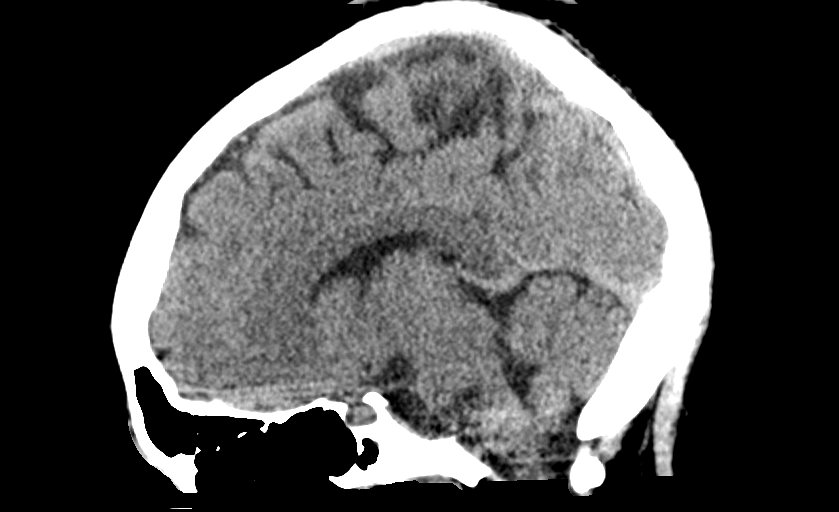
[im 44/66  brain]
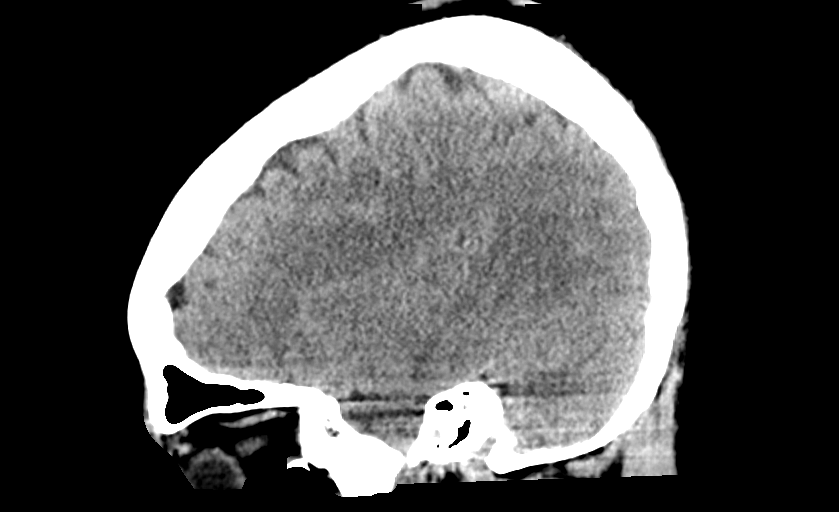

[14 of 47 positions shown; findings below may reference images not displayed]

FINDINGS: Brain: No evidence of acute infarction, hemorrhage, hydrocephalus,
extra-axial collection or mass lesion/mass effect.

Vascular: Negative for hyperdense vessel

Skull: Negative

Sinuses/Orbits: Negative

Other: None
IMPRESSION: Normal CT head

## 2024-04-01 ENCOUNTER — Emergency Department (HOSPITAL_COMMUNITY): Payer: Self-pay

## 2024-04-01 ENCOUNTER — Emergency Department (HOSPITAL_COMMUNITY)
Admission: EM | Admit: 2024-04-01 | Discharge: 2024-04-01 | Disposition: A | Discharge status: Home / Self Care | Payer: Self-pay

## 2024-04-01 ENCOUNTER — Encounter (HOSPITAL_COMMUNITY): Payer: Self-pay

## 2024-04-01 ENCOUNTER — Other Ambulatory Visit: Payer: Self-pay

## 2024-04-01 DIAGNOSIS — S3991XA Unspecified injury of abdomen, initial encounter: Secondary | ICD-10-CM | POA: Insufficient documentation

## 2024-04-01 DIAGNOSIS — M542 Cervicalgia: Secondary | ICD-10-CM | POA: Insufficient documentation

## 2024-04-01 DIAGNOSIS — R519 Headache, unspecified: Secondary | ICD-10-CM

## 2024-04-01 DIAGNOSIS — M79601 Pain in right arm: Secondary | ICD-10-CM | POA: Insufficient documentation

## 2024-04-01 DIAGNOSIS — S299XXA Unspecified injury of thorax, initial encounter: Secondary | ICD-10-CM | POA: Insufficient documentation

## 2024-04-01 DIAGNOSIS — S0990XA Unspecified injury of head, initial encounter: Secondary | ICD-10-CM | POA: Insufficient documentation

## 2024-04-01 DIAGNOSIS — M25521 Pain in right elbow: Secondary | ICD-10-CM | POA: Insufficient documentation

## 2024-04-01 DIAGNOSIS — Y92410 Unspecified street and highway as the place of occurrence of the external cause: Secondary | ICD-10-CM | POA: Insufficient documentation

## 2024-04-01 LAB — HCG, SERUM, QUALITATIVE: Preg, Serum: NEGATIVE

## 2024-04-01 LAB — CBC
HCT: 41.9 % (ref 36.0–46.0)
Hemoglobin: 13.6 g/dL (ref 12.0–15.0)
MCH: 27.9 pg (ref 26.0–34.0)
MCHC: 32.5 g/dL (ref 30.0–36.0)
MCV: 85.9 fL (ref 80.0–100.0)
Platelets: 309 K/uL (ref 150–400)
RBC: 4.88 MIL/uL (ref 3.87–5.11)
RDW: 12.8 % (ref 11.5–15.5)
WBC: 6.5 K/uL (ref 4.0–10.5)
nRBC: 0 % (ref 0.0–0.2)

## 2024-04-01 LAB — COMPREHENSIVE METABOLIC PANEL WITH GFR
ALT: 16 U/L (ref 0–44)
AST: 29 U/L (ref 15–41)
Albumin: 4 g/dL (ref 3.5–5.0)
Alkaline Phosphatase: 68 U/L (ref 38–126)
Anion gap: 10 (ref 5–15)
BUN: 18 mg/dL (ref 6–20)
CO2: 23 mmol/L (ref 22–32)
Calcium: 8.9 mg/dL (ref 8.9–10.3)
Chloride: 102 mmol/L (ref 98–111)
Creatinine, Ser: 1.06 mg/dL — ABNORMAL HIGH (ref 0.44–1.00)
GFR, Estimated: >60 mL/min
Glucose, Bld: 81 mg/dL (ref 70–99)
Potassium: 4 mmol/L (ref 3.5–5.1)
Sodium: 136 mmol/L (ref 135–145)
Total Bilirubin: 0.3 mg/dL (ref 0.0–1.2)
Total Protein: 7.2 g/dL (ref 6.5–8.1)

## 2024-04-01 LAB — I-STAT CHEM 8, ED
BUN: 24 mg/dL — ABNORMAL HIGH (ref 6–20)
Calcium, Ion: 1.11 mmol/L — ABNORMAL LOW (ref 1.15–1.40)
Chloride: 102 mmol/L (ref 98–111)
Creatinine, Ser: 1.2 mg/dL — ABNORMAL HIGH (ref 0.44–1.00)
Glucose, Bld: 80 mg/dL (ref 70–99)
HCT: 43 % (ref 36.0–46.0)
Hemoglobin: 14.6 g/dL (ref 12.0–15.0)
Potassium: 4.5 mmol/L (ref 3.5–5.1)
Sodium: 138 mmol/L (ref 135–145)
TCO2: 26 mmol/L (ref 22–32)

## 2024-04-01 LAB — PROTIME-INR
INR: 1 (ref 0.8–1.2)
Prothrombin Time: 13.3 s (ref 11.4–15.2)

## 2024-04-01 LAB — I-STAT CG4 LACTIC ACID, ED: Lactic Acid, Venous: 0.9 mmol/L (ref 0.5–1.9)

## 2024-04-01 LAB — ETHANOL: Alcohol, Ethyl (B): <15 mg/dL

## 2024-04-01 MED ORDER — ACETAMINOPHEN 500 MG PO TABS
1000.0000 mg | ORAL_TABLET | Freq: Once | ORAL | Status: AC
  Administered 2024-04-01: 1000 mg via ORAL
  Filled 2024-04-01: qty 2

## 2024-04-01 MED ORDER — IOHEXOL 350 MG/ML SOLN
60.0000 mL | Freq: Once | INTRAVENOUS | Status: AC | PRN
  Administered 2024-04-01: 60 mL via INTRAVENOUS

## 2024-04-01 MED ORDER — KETOROLAC TROMETHAMINE 15 MG/ML IJ SOLN
15.0000 mg | Freq: Once | INTRAMUSCULAR | Status: AC
  Administered 2024-04-01: 15 mg via INTRAVENOUS
  Filled 2024-04-01: qty 1
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
## 2024-04-01 NOTE — ED Notes (Signed)
 Pt provided with discharge and follow up instructions, medications discussed, pt verbalized understanding. LDA removed, VSS, pt ambulatory out of ED w/ all paperwork and belongings in NAD.

## 2024-04-01 NOTE — ED Notes (Signed)
 Pt to CT w/ RN
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
## 2024-04-01 NOTE — Progress Notes (Signed)
 Orthopedic Tech Progress Note Patient Details:  Michele Soto 2001/03/17 [id number]  Ortho Devices Type of Ortho Device: Arm sling Ortho Device/Splint Location: R ARM Ortho Device/Splint Interventions: Ordered   Post Interventions Patient Tolerated: Well Instructions Provided: Care of device  Markisha Meding L Sanay Belmar 04/01/2024, 11:00 PM
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
